# Patient Record
Sex: Male | Born: 1980
Health system: Southern US, Community
[De-identification: ages and names within clinical notes are randomized; demographics above are authoritative.]

## PROBLEM LIST (undated history)

## (undated) DIAGNOSIS — R42 Dizziness and giddiness: Secondary | ICD-10-CM

---

## 2015-06-15 ENCOUNTER — Encounter (HOSPITAL_BASED_OUTPATIENT_CLINIC_OR_DEPARTMENT_OTHER): Payer: Self-pay | Admitting: Emergency Medicine

## 2015-06-15 ENCOUNTER — Emergency Department (HOSPITAL_BASED_OUTPATIENT_CLINIC_OR_DEPARTMENT_OTHER)
Admission: EM | Admit: 2015-06-15 | Discharge: 2015-06-15 | Disposition: A | Discharge status: Home / Self Care | Payer: Self-pay | Attending: Emergency Medicine | Admitting: Emergency Medicine

## 2015-06-15 DIAGNOSIS — Z791 Long term (current) use of non-steroidal anti-inflammatories (NSAID): Secondary | ICD-10-CM | POA: Insufficient documentation

## 2015-06-15 DIAGNOSIS — L089 Local infection of the skin and subcutaneous tissue, unspecified: Secondary | ICD-10-CM | POA: Insufficient documentation

## 2015-06-15 DIAGNOSIS — F17210 Nicotine dependence, cigarettes, uncomplicated: Secondary | ICD-10-CM | POA: Insufficient documentation

## 2015-06-15 MED ORDER — SULFAMETHOXAZOLE-TRIMETHOPRIM 800-160 MG PO TABS: 1.0000 | ORAL_TABLET | Freq: Two times a day (BID) | ORAL | Status: AC

## 2015-06-15 MED FILL — SULFAMETHOXAZOLE-TMP DS TAB: 800-160 | 7 days supply | Qty: 14 | Fill #0

## 2015-06-15 NOTE — Discharge Instructions (Signed)
Please use warm compresses, monitor for signs of infection. Please return immediately if any new or worsening signs or symptoms present. Please use antibiotics as directed

## 2015-06-15 NOTE — ED Provider Notes (Signed)
CSN: [id number]     Arrival date & time 06/15/15  0907 History   First MD Initiated Contact with Patient 06/15/15 0920     Chief Complaint  Patient presents with  . Facial Swelling     (Consider location/radiation/quality/duration/timing/severity/associated sxs/prior Treatment) HPI   35 year old male presents today with reports of facial swelling. Patient reports that 4 days ago he had what appeared to be a pimple at the entrance of his right knee area. He reports that current size, turned white. He reports that 2 days ago he popped the pimple with noted discharge. Patient reports that yesterday he noticed swelling to the surrounding area. Patient denies any fever, chills, nausea, vomiting. Patient reports swelling to the upper lip. Patient notes a history of previous skin infections requiring drainage. No medications prior to arrival   History reviewed. No pertinent past medical history. History reviewed. No pertinent past surgical history. History reviewed. No pertinent family history. Social History  Substance Use Topics  . Smoking status: Current Every Day Smoker -- 2.00 packs/day    Types: Cigarettes  . Smokeless tobacco: None  . Alcohol Use: No    Review of Systems  All other systems reviewed and are negative.   Allergies  Review of patient's allergies indicates no known allergies.  Home Medications   Prior to Admission medications   Medication Sig Start Date End Date Taking? Authorizing Provider  ibuprofen (ADVIL,MOTRIN) 400 MG tablet Take 400 mg by mouth every 6 (six) hours as needed.   Yes Historical Provider, MD  sulfamethoxazole-trimethoprim (BACTRIM DS,SEPTRA DS) 800-160 MG tablet Take 1 tablet by mouth 2 (two) times daily. 06/15/15 06/22/15  Eyvonne Mechanic, PA-C   There were no vitals taken for this visit.   Physical Exam  Constitutional: He is oriented to person, place, and time. He appears well-developed and well-nourished.  HENT:  Head: Normocephalic and  atraumatic.  No swelling to face  Eyes: Conjunctivae are normal. Pupils are equal, round, and reactive to light. Right eye exhibits no discharge. Left eye exhibits no discharge. No scleral icterus.  Neck: Normal range of motion. No JVD present. No tracheal deviation present.  Pulmonary/Chest: Effort normal. No stridor.  Neurological: He is alert and oriented to person, place, and time. Coordination normal.  Skin:  There is small area of tenderness and swelling inside the right naris. No signs of surrounding swelling, edema, warmth or tenderness to palpation. No appreciable abscess on ultrasound  Psychiatric: He has a normal mood and affect. His behavior is normal. Judgment and thought content normal.  Nursing note and vitals reviewed.   ED Course  Procedures (including critical care time)  EMERGENCY DEPARTMENT US SOFT TISSUE INTERPRETATION "Study: Limited Ultrasound of the noted body part in comments below"  INDICATIONS: Soft tissue infection Multiple views of the body part are obtained with a multi-frequency linear probe  PERFORMED BY:  Myself  IMAGES ARCHIVED?: Yes  SIDE:Right   BODY PART:Other soft tisse (comment in note)  FINDINGS: No abcess noted  LIMITATIONS:  Body Habitus  INTERPRETATION:  No abcess noted  COMMENT:  No appreciable abscess noted, minor soft tissue swelling     Labs Review Labs Reviewed - No data to display  Imaging Review No results found. I have personally reviewed and evaluated these images and lab results as part of my medical decision-making.   EKG Interpretation None      MDM   Final diagnoses:  Skin infection    Labs:  Imaging:  Consults:  Therapeutics:  Discharge Meds: Bactrim  Assessment/Plan: 35 year old male presents today with an infection. Patient had what appears to be a pimple that he popped and now has surrounding infection. This appears very localized, very minimal, with no appreciable abscess on ultrasound.  Due to the location, the fact that the patient likely push the infection deeper, and history skin infections patient was started on antibiotics. He is instructed to use warm compresses, return in 2 days for reevaluation if symptoms do not improve, return immediately if they worsen. Patient is afebrile, nontoxic in no acute distress with no signs of surrounding infection. Patient discharged home. He verbalized understanding and agreement today's plan had no further questions or concerns at the time of discharge         Eyvonne MechanicJeffrey Jihad Brownlow, PA-C 06/15/15 1707  Laurence Spatesachel Morgan Little, MD 06/17/15 [phone]

## 2015-06-15 NOTE — ED Notes (Signed)
Pt directed to pharmacy to pick up prescriptions-  

## 2015-06-15 NOTE — ED Notes (Signed)
Pt reports noticing a pimple on right inner nostril on Friday, attempted to "pop" area on Friday, Saturday am noticed right nare swelling, now with right nare edema extending into right side of nose and face, denies difficulty swallowing or breathing

## 2015-11-04 ENCOUNTER — Emergency Department (HOSPITAL_BASED_OUTPATIENT_CLINIC_OR_DEPARTMENT_OTHER)
Admission: EM | Admit: 2015-11-04 | Discharge: 2015-11-04 | Disposition: A | Discharge status: Home / Self Care | Payer: No Typology Code available for payment source | Attending: Emergency Medicine | Admitting: Emergency Medicine

## 2015-11-04 ENCOUNTER — Encounter (HOSPITAL_BASED_OUTPATIENT_CLINIC_OR_DEPARTMENT_OTHER): Payer: Self-pay | Admitting: *Deleted

## 2015-11-04 DIAGNOSIS — M545 Low back pain: Secondary | ICD-10-CM | POA: Insufficient documentation

## 2015-11-04 DIAGNOSIS — F17210 Nicotine dependence, cigarettes, uncomplicated: Secondary | ICD-10-CM | POA: Diagnosis not present

## 2015-11-04 DIAGNOSIS — Y92410 Unspecified street and highway as the place of occurrence of the external cause: Secondary | ICD-10-CM | POA: Diagnosis not present

## 2015-11-04 DIAGNOSIS — M549 Dorsalgia, unspecified: Secondary | ICD-10-CM

## 2015-11-04 DIAGNOSIS — Y999 Unspecified external cause status: Secondary | ICD-10-CM | POA: Insufficient documentation

## 2015-11-04 DIAGNOSIS — M542 Cervicalgia: Secondary | ICD-10-CM | POA: Diagnosis present

## 2015-11-04 DIAGNOSIS — Y9389 Activity, other specified: Secondary | ICD-10-CM | POA: Insufficient documentation

## 2015-11-04 MED ORDER — METHOCARBAMOL 500 MG PO TABS: 500.0000 mg | ORAL_TABLET | Freq: Two times a day (BID) | ORAL | 0 refills | Status: DC

## 2015-11-04 MED ORDER — IBUPROFEN 600 MG PO TABS: 600.0000 mg | ORAL_TABLET | Freq: Four times a day (QID) | ORAL | 0 refills | Status: DC | PRN
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
## 2016-08-08 ENCOUNTER — Emergency Department (HOSPITAL_BASED_OUTPATIENT_CLINIC_OR_DEPARTMENT_OTHER)
Admission: EM | Admit: 2016-08-08 | Discharge: 2016-08-08 | Disposition: A | Discharge status: Home / Self Care | Payer: Commercial Managed Care - PPO | Attending: Emergency Medicine | Admitting: Emergency Medicine

## 2016-08-08 ENCOUNTER — Encounter (HOSPITAL_BASED_OUTPATIENT_CLINIC_OR_DEPARTMENT_OTHER): Payer: Self-pay | Admitting: *Deleted

## 2016-08-08 DIAGNOSIS — F17210 Nicotine dependence, cigarettes, uncomplicated: Secondary | ICD-10-CM | POA: Insufficient documentation

## 2016-08-08 DIAGNOSIS — R51 Headache: Secondary | ICD-10-CM | POA: Diagnosis present

## 2016-08-08 DIAGNOSIS — G44219 Episodic tension-type headache, not intractable: Secondary | ICD-10-CM | POA: Diagnosis not present

## 2016-08-08 HISTORY — DX: Dizziness and giddiness: R42

## 2016-08-08 LAB — CBG MONITORING, ED: GLUCOSE-CAPILLARY: 105 mg/dL — AB (ref 65–99)

## 2016-08-08 MED ORDER — KETOROLAC TROMETHAMINE 60 MG/2ML IM SOLN
60.0000 mg | Freq: Once | INTRAMUSCULAR | Status: AC
  Administered 2016-08-08: 60 mg via INTRAMUSCULAR
  Filled 2016-08-08: qty 2

## 2016-08-08 MED ORDER — MECLIZINE HCL 25 MG PO TABS: 25.0000 mg | ORAL_TABLET | Freq: Three times a day (TID) | ORAL | 0 refills | Status: DC | PRN

## 2016-08-08 NOTE — ED Triage Notes (Signed)
Pt c/o h/a, dizziness and blurred vision , on and off x 3 days

## 2016-08-08 NOTE — ED Provider Notes (Signed)
[facility]   CSN: [id number] Arrival date & time: 08/08/16  1542  By signing my name below, I, Henry Andrews, attest that this documentation has been prepared under the direction and in the presence of Rolan Bucco, MD. Electronically Signed: Cynda Andrews, Scribe. 08/08/16. 4:38 PM.  History   Chief Complaint Chief Complaint  Patient presents with  . Headache   HPI Comments: Henry Andrews is a 36 y.o. male with a history of vertigo, who presents to the Emergency Department complaining of gradual-onset, intermittent bifrontal headache that began three days ago. Patient states he initially thought he had a cold, but his headache has persisted. Patient reports a dull, aching headache at present. Patient denies any recent falls or head injuries. Patient reports similar headaches in the past when he was diagnosed with vertigo. Patient states he has had one episode of vertigo, but has not had any vertigo symptoms since.  Patient reports taking pseudofed with no relief. Nothing improves or worsens his headache. No past medical history of diabetes, stroke, or cancer. Patient denies any current dizziness, visual changes, numbness, speech deficits, neck pain, back pain, fever, chills, or LE edema. No recent head trauma.  The history is provided by the patient. No language interpreter was used.    Past Medical History:  Diagnosis Date  . Vertigo     There are no active problems to display for this patient.   History reviewed. No pertinent surgical history.     Home Medications    Prior to Admission medications   Medication Sig Start Date End Date Taking? Authorizing Provider  ibuprofen (ADVIL,MOTRIN) 600 MG tablet Take 1 tablet (600 mg total) by mouth every 6 (six) hours as needed. 11/04/15   Santiago Glad, PA-C  meclizine (ANTIVERT) 25 MG tablet Take 1 tablet (25 mg total) by mouth 3 (three) times daily as needed for dizziness. 08/08/16   Rolan Bucco, MD      Family History No family history on file.  Social History Social History  Substance Use Topics  . Smoking status: Current Every Day Smoker    Packs/day: 2.00    Types: Cigarettes  . Smokeless tobacco: Not on file  . Alcohol use No     Allergies   Patient has no known allergies.   Review of Systems Review of Systems  Constitutional: Negative for chills, diaphoresis, fatigue and fever.  HENT: Negative for congestion, rhinorrhea and sneezing.   Eyes: Negative.  Negative for visual disturbance.  Respiratory: Negative for cough, chest tightness and shortness of breath.   Cardiovascular: Negative for chest pain and leg swelling.  Gastrointestinal: Negative for abdominal pain, blood in stool, diarrhea, nausea and vomiting.  Genitourinary: Negative for difficulty urinating, flank pain, frequency and hematuria.  Musculoskeletal: Negative for arthralgias, back pain, gait problem and neck pain.  Skin: Negative for rash.  Neurological: Positive for dizziness, light-headedness and headaches. Negative for speech difficulty, weakness and numbness.  All other systems reviewed and are negative.    Physical Exam Updated Vital Signs BP 119/81 (BP Location: Right Arm)   Pulse (!) 56   Temp 97.7 F (36.5 C)   Resp 16   Ht 5\' 10"  (1.778 m)   Wt 111.1 kg (245 lb)   SpO2 100%   BMI 35.15 kg/m   Physical Exam  Constitutional: He is oriented to person, place, and time. He appears well-developed and well-nourished.  HENT:  Head: Normocephalic and atraumatic.  Mouth/Throat: Oropharynx is clear and moist.  No  TTP over sinuses/temporal arteries  Eyes: Conjunctivae and EOM are normal. Pupils are equal, round, and reactive to light.  No nystagmus  Neck: Normal range of motion. Neck supple.  Cardiovascular: Normal rate, regular rhythm and normal heart sounds.   Pulmonary/Chest: Effort normal and breath sounds normal. No respiratory distress. He has no wheezes. He has no rales. He  exhibits no tenderness.  Abdominal: Soft. Bowel sounds are normal. There is no tenderness. There is no rebound and no guarding.  Musculoskeletal: Normal range of motion. He exhibits no edema.  Lymphadenopathy:    He has no cervical adenopathy.  Neurological: He is alert and oriented to person, place, and time. He displays normal reflexes. No cranial nerve deficit or sensory deficit. He exhibits normal muscle tone. Coordination normal.  Motor 5/5 all extremities Sensation grossly intact to LT all extremities Finger to Nose intact, no pronator drift CN II-XII grossly intact Gait normal   Skin: Skin is warm and dry. No rash noted.  Psychiatric: He has a normal mood and affect.  Nursing note and vitals reviewed.    ED Treatments / Results  DIAGNOSTIC STUDIES: Oxygen Saturation is 100% on RA, normal by my interpretation.    COORDINATION OF CARE: 4:37 PM Discussed treatment plan with pt at bedside and pt agreed to plan.    Labs (all labs ordered are listed, but only abnormal results are displayed) Labs Reviewed  CBG MONITORING, ED - Abnormal; Notable for the following:       Result Value   Glucose-Capillary 105 (*)    All other components within normal limits    EKG  EKG Interpretation None       Radiology No results found.  Procedures Procedures (including critical care time)  Medications Ordered in ED Medications  ketorolac (TORADOL) injection 60 mg (60 mg Intramuscular Given 08/08/16 1645)     Initial Impression / Assessment and Plan / ED Course  I have reviewed the triage vital signs and the nursing notes.  Pertinent labs & imaging results that were available during my care of the patient were reviewed by me and considered in my medical decision making (see chart for details).     Patient presents with a bifrontal-type headache. There is no symptoms that would be more suggestive of subarachnoid hemorrhage or meningitis. He has no neurologic deficits. He is able  to ambulate without ataxia. He was given dose of Toradol in the ED and feels it's better after that. He states his headache is almost gone. I don't feel this point that he needs imaging studies. He was discharged home in good condition. He currently denies any vertiginous type symptoms but he states he does get it intermittently and is requesting some meclizine to help treat the vertigo when he does get the symptoms. He was encouraged to establish care with a primary care provider. He was given a list of low-cost health clinics. He was given return precautions. His repeat blood pressure was 124/83.  Final Clinical Impressions(s) / ED Diagnoses   Final diagnoses:  Episodic tension-type headache, not intractable    New Prescriptions New Prescriptions   MECLIZINE (ANTIVERT) 25 MG TABLET    Take 1 tablet (25 mg total) by mouth 3 (three) times daily as needed for dizziness.   I personally performed the services described in this documentation, which was scribed in my presence.  The recorded information has been reviewed and considered.     Rolan BuccoBelfi, Elion Hocker, MD 08/08/16 [phone]

## 2016-09-26 ENCOUNTER — Emergency Department (HOSPITAL_BASED_OUTPATIENT_CLINIC_OR_DEPARTMENT_OTHER)
Admission: EM | Admit: 2016-09-26 | Discharge: 2016-09-27 | Disposition: A | Discharge status: Home / Self Care | Payer: Commercial Managed Care - PPO | Attending: Emergency Medicine | Admitting: Emergency Medicine

## 2016-09-26 ENCOUNTER — Encounter (HOSPITAL_BASED_OUTPATIENT_CLINIC_OR_DEPARTMENT_OTHER): Payer: Self-pay | Admitting: *Deleted

## 2016-09-26 DIAGNOSIS — F17210 Nicotine dependence, cigarettes, uncomplicated: Secondary | ICD-10-CM | POA: Insufficient documentation

## 2016-09-26 DIAGNOSIS — Z79899 Other long term (current) drug therapy: Secondary | ICD-10-CM | POA: Diagnosis not present

## 2016-09-26 DIAGNOSIS — R51 Headache: Secondary | ICD-10-CM | POA: Diagnosis present

## 2016-09-26 DIAGNOSIS — R519 Headache, unspecified: Secondary | ICD-10-CM

## 2016-09-26 NOTE — ED Provider Notes (Signed)
[facility] Provider Note: Lowella Dell, MD, FACEP  CSN: [id number] MRN: [id number] ARRIVAL: 09/26/16 at 2034 ROOM: [facility]08/[facility]08   CHIEF COMPLAINT  Headache   HISTORY OF PRESENT ILLNESS  09/26/16 11:59 PM Henry Andrews is a 36 y.o. male who awoke with a headache this morning. The headache is located bifrontally. He rates it as a 6 or 7 out of 10 and dull. He has had several episodes of vertigo throughout the day. These episodes last about 3 minutes. They're worse with movement of the head, particularly bending over. They're not sustained. He took ibuprofen and meclizine about 6 PM without relief. He denies visual changes, blurred vision, nausea, vomiting, hearing changes or focal neurologic changes. He had similar symptoms in July and was diagnosed with episodic tension-type headache.   Past Medical History:  Diagnosis Date  . Vertigo     History reviewed. No pertinent surgical history.  No family history on file.  Social History  Substance Use Topics  . Smoking status: Current Every Day Smoker    Packs/day: 2.00    Types: Cigarettes  . Smokeless tobacco: Never Used  . Alcohol use No    Prior to Admission medications   Medication Sig Start Date End Date Taking? Authorizing Provider  ibuprofen (ADVIL,MOTRIN) 600 MG tablet Take 1 tablet (600 mg total) by mouth every 6 (six) hours as needed. 11/04/15   Santiago Glad, PA-C  meclizine (ANTIVERT) 25 MG tablet Take 1 tablet (25 mg total) by mouth 3 (three) times daily as needed for dizziness. 08/08/16   Rolan Bucco, MD    Allergies Patient has no known allergies.   REVIEW OF SYSTEMS  Negative except as noted here or in the History of Present Illness.   PHYSICAL EXAMINATION  Initial Vital Signs Blood pressure (!) 142/95, pulse 92, temperature 98.5 F (36.9 C), temperature source Oral, resp. rate 18, height 5\' 10"  (1.778 m), weight 113.4 kg (250 lb), SpO2 99 %.  Examination General: Well-developed,  well-nourished male in no acute distress; appearance consistent with age of record HENT: normocephalic; atraumatic Eyes: pupils equal, round and reactive to light; extraocular muscles intact; no nystagmus Neck: supple Heart: regular rate and rhythm Lungs: clear to auscultation bilaterally Abdomen: soft; nondistended; nontender; bowel sounds present Extremities: No deformity; full range of motion; pulses normal Neurologic: Awake, alert and oriented; motor function intact in all extremities and symmetric; no facial droop Skin: Warm and dry Psychiatric: Normal mood and affect   RESULTS  Summary of this visit's results, reviewed by myself:   EKG Interpretation  Date/Time:    Ventricular Rate:    PR Interval:    QRS Duration:   QT Interval:    QTC Calculation:   R Axis:     Text Interpretation:        Laboratory Studies: No results found for this or any previous visit (from the past 24 hour(s)). Imaging Studies: No results found.  ED COURSE  Nursing notes and initial vitals signs, including pulse oximetry, reviewed.  Vitals:   09/26/16 2049 09/26/16 2051 09/26/16 2300 09/27/16 0000  BP:  (!) 141/98 (!) 142/95 117/71  Pulse:  77 92 61  Resp:  18 18 20   Temp:  98.5 F (36.9 C)    TempSrc:  Oral    SpO2:  99% 99% 100%  Weight: 113.4 kg (250 lb)     Height: 5\' 10"  (1.778 m)      12:48 AM Headache improved after IV fluids and medications. Patient states  she is ready to go home.  PROCEDURES    ED DIAGNOSES     ICD-10-CM   1. Bad headache R51        Adams Hinch, MD 09/27/16 [phone]

## 2016-09-26 NOTE — ED Triage Notes (Signed)
Headache and dizziness. He was seen for same last month and told to take Ibuprofen and Meclizine. He has take the medications today with no relief.

## 2016-09-27 MED ORDER — DIPHENHYDRAMINE HCL 50 MG/ML IJ SOLN
25.0000 mg | Freq: Once | INTRAMUSCULAR | Status: AC
  Administered 2016-09-27: 25 mg via INTRAVENOUS
  Filled 2016-09-27: qty 1

## 2016-09-27 MED ORDER — SODIUM CHLORIDE 0.9 % IV BOLUS (SEPSIS)
1000.0000 mL | Freq: Once | INTRAVENOUS | Status: AC
  Administered 2016-09-27: 1000 mL via INTRAVENOUS

## 2016-09-27 MED ORDER — METOCLOPRAMIDE HCL 5 MG/ML IJ SOLN
10.0000 mg | Freq: Once | INTRAMUSCULAR | Status: AC
  Administered 2016-09-27: 10 mg via INTRAVENOUS
  Filled 2016-09-27: qty 2

## 2016-09-27 MED ORDER — KETOROLAC TROMETHAMINE 15 MG/ML IJ SOLN
15.0000 mg | Freq: Once | INTRAMUSCULAR | Status: AC
  Administered 2016-09-27: 15 mg via INTRAVENOUS
  Filled 2016-09-27: qty 1

## 2017-03-14 ENCOUNTER — Encounter (HOSPITAL_BASED_OUTPATIENT_CLINIC_OR_DEPARTMENT_OTHER): Payer: Self-pay

## 2017-03-14 ENCOUNTER — Emergency Department (HOSPITAL_BASED_OUTPATIENT_CLINIC_OR_DEPARTMENT_OTHER): Payer: Commercial Managed Care - PPO

## 2017-03-14 ENCOUNTER — Emergency Department (HOSPITAL_BASED_OUTPATIENT_CLINIC_OR_DEPARTMENT_OTHER)
Admission: EM | Admit: 2017-03-14 | Discharge: 2017-03-14 | Disposition: A | Discharge status: Home / Self Care | Payer: Commercial Managed Care - PPO | Attending: Emergency Medicine | Admitting: Emergency Medicine

## 2017-03-14 DIAGNOSIS — F17210 Nicotine dependence, cigarettes, uncomplicated: Secondary | ICD-10-CM | POA: Diagnosis not present

## 2017-03-14 DIAGNOSIS — R1110 Vomiting, unspecified: Secondary | ICD-10-CM | POA: Diagnosis present

## 2017-03-14 DIAGNOSIS — J189 Pneumonia, unspecified organism: Secondary | ICD-10-CM

## 2017-03-14 DIAGNOSIS — R69 Illness, unspecified: Secondary | ICD-10-CM

## 2017-03-14 DIAGNOSIS — J111 Influenza due to unidentified influenza virus with other respiratory manifestations: Secondary | ICD-10-CM

## 2017-03-14 LAB — CBC WITH DIFFERENTIAL/PLATELET
Basophils Absolute: 0 10*3/uL (ref 0.0–0.1)
Basophils Relative: 0 %
EOS ABS: 0.1 10*3/uL (ref 0.0–0.7)
Eosinophils Relative: 2 %
HCT: 42.9 % (ref 39.0–52.0)
HEMOGLOBIN: 14.3 g/dL (ref 13.0–17.0)
LYMPHS ABS: 2.7 10*3/uL (ref 0.7–4.0)
LYMPHS PCT: 38 %
MCH: 28.3 pg (ref 26.0–34.0)
MCHC: 33.3 g/dL (ref 30.0–36.0)
MCV: 85 fL (ref 78.0–100.0)
Monocytes Absolute: 0.6 10*3/uL (ref 0.1–1.0)
Monocytes Relative: 9 %
NEUTROS ABS: 3.6 10*3/uL (ref 1.7–7.7)
Neutrophils Relative %: 51 %
Platelets: 244 10*3/uL (ref 150–400)
RBC: 5.05 MIL/uL (ref 4.22–5.81)
RDW: 13.9 % (ref 11.5–15.5)
WBC: 7 10*3/uL (ref 4.0–10.5)

## 2017-03-14 LAB — COMPREHENSIVE METABOLIC PANEL
ALK PHOS: 63 U/L (ref 38–126)
ALT: 39 U/L (ref 17–63)
ANION GAP: 9 (ref 5–15)
AST: 23 U/L (ref 15–41)
Albumin: 4 g/dL (ref 3.5–5.0)
BILIRUBIN TOTAL: 0.6 mg/dL (ref 0.3–1.2)
BUN: 13 mg/dL (ref 6–20)
CALCIUM: 8.9 mg/dL (ref 8.9–10.3)
CO2: 23 mmol/L (ref 22–32)
Chloride: 106 mmol/L (ref 101–111)
Creatinine, Ser: 0.91 mg/dL (ref 0.61–1.24)
GFR calc Af Amer: >60 mL/min (ref >60)
GFR calc non Af Amer: >60 mL/min (ref >60)
GLUCOSE: 114 mg/dL — AB (ref 65–99)
Potassium: 3.4 mmol/L — ABNORMAL LOW (ref 3.5–5.1)
SODIUM: 138 mmol/L (ref 135–145)
Total Protein: 7.8 g/dL (ref 6.5–8.1)

## 2017-03-14 MED ORDER — SODIUM CHLORIDE 0.9 % IV BOLUS (SEPSIS)
1000.0000 mL | Freq: Once | INTRAVENOUS | Status: AC
Start: 2017-03-14 — End: 2017-03-14
  Administered 2017-03-14: 1000 mL via INTRAVENOUS

## 2017-03-14 MED ORDER — POTASSIUM CHLORIDE CRYS ER 20 MEQ PO TBCR
40.0000 meq | EXTENDED_RELEASE_TABLET | Freq: Once | ORAL | Status: AC
  Administered 2017-03-14: 40 meq via ORAL
  Filled 2017-03-14: qty 2

## 2017-03-14 MED ORDER — AZITHROMYCIN 250 MG PO TABS
250.0000 mg | ORAL_TABLET | Freq: Every day | ORAL | 0 refills | Status: DC
Start: 2017-03-14 — End: 2017-04-17

## 2017-03-14 MED FILL — AZITHROMYCIN 250 MG TABLET: 250 | 5 days supply | Qty: 6 | Fill #0

## 2017-03-14 NOTE — ED Triage Notes (Signed)
Pt reports N/V/D, cough, and fatigue x 2 days.

## 2017-03-14 NOTE — ED Notes (Signed)
Patient given ginger ale for fluid challenge. 

## 2017-03-14 NOTE — ED Provider Notes (Signed)
[facility] EMERGENCY DEPARTMENT Provider Note   CSN: [id number] Arrival date & time: 03/14/17  4098     History   Chief Complaint Chief Complaint  Patient presents with  . Emesis    HPI Henry Andrews is a 37 y.o. male.  HPI  37 year old male presents with flulike symptoms and concern for the flu.  He states that starting 2 days ago when he first woke up he developed cough with occasional yellow sputum, sore throat, body aches, and yesterday had vomiting and diarrhea.  He is not sure how many times he had the vomiting and diarrhea but has not had any this morning.  Some lightheadedness.  He has not had anything to eat or drink this morning.  He states he does not feel short of breath but has had some chest pain when coughing.  Past Medical History:  Diagnosis Date  . Vertigo     There are no active problems to display for this patient.   History reviewed. No pertinent surgical history.     Home Medications    Prior to Admission medications   Medication Sig Start Date End Date Taking? Authorizing Provider  azithromycin (ZITHROMAX) 250 MG tablet Take 1 tablet (250 mg total) by mouth daily. Take first 2 tablets together, then 1 every day until finished. 03/14/17   Pricilla Loveless, MD  ibuprofen (ADVIL,MOTRIN) 600 MG tablet Take 1 tablet (600 mg total) by mouth every 6 (six) hours as needed. 11/04/15   Santiago Glad, PA-C  meclizine (ANTIVERT) 25 MG tablet Take 1 tablet (25 mg total) by mouth 3 (three) times daily as needed for dizziness. 08/08/16   Rolan Bucco, MD    Family History No family history on file.  Social History Social History   Tobacco Use  . Smoking status: Current Every Day Smoker    Packs/day: 2.00    Types: Cigarettes  . Smokeless tobacco: Never Used  Substance Use Topics  . Alcohol use: No  . Drug use: No     Allergies   Patient has no known allergies.   Review of Systems Review of Systems  Constitutional: Negative for  fever.  HENT: Positive for sore throat.   Respiratory: Positive for cough. Negative for shortness of breath.   Cardiovascular: Positive for chest pain.  Gastrointestinal: Positive for diarrhea and vomiting. Negative for abdominal pain.  Musculoskeletal: Positive for myalgias.  All other systems reviewed and are negative.    Physical Exam Updated Vital Signs BP 117/79 (BP Location: Right Arm)   Pulse 66   Temp 98.8 F (37.1 C) (Oral)   Resp (!) 22   Ht 5\' 10"  (1.778 m)   Wt 117.9 kg (260 lb)   SpO2 98%   BMI 37.31 kg/m   Physical Exam  Constitutional: He is oriented to person, place, and time. He appears well-developed and well-nourished. No distress.  HENT:  Head: Normocephalic and atraumatic.  Right Ear: External ear normal.  Left Ear: External ear normal.  Nose: Nose normal.  Mouth/Throat: Oropharynx is clear and moist. No oropharyngeal exudate.  Eyes: Right eye exhibits no discharge. Left eye exhibits no discharge.  Neck: Neck supple.  Cardiovascular: Normal rate, regular rhythm and normal heart sounds.  Pulmonary/Chest: Effort normal and breath sounds normal. He has no wheezes. He has no rales.  Abdominal: Soft. He exhibits no distension. There is no tenderness.  Musculoskeletal: He exhibits no edema.  Neurological: He is alert and oriented to person, place, and time.  Skin: Skin  is warm and dry. He is not diaphoretic.  Nursing note and vitals reviewed.    ED Treatments / Results  Labs (all labs ordered are listed, but only abnormal results are displayed) Labs Reviewed  COMPREHENSIVE METABOLIC PANEL - Abnormal; Notable for the following components:      Result Value   Potassium 3.4 (*)    Glucose, Bld 114 (*)    All other components within normal limits  CBC WITH DIFFERENTIAL/PLATELET    EKG  EKG Interpretation None       Radiology Dg Chest 2 View  Result Date: 03/14/2017 CLINICAL DATA:  Cough and fatigue.  Nausea, vomiting, diarrhea. EXAM: CHEST   2 VIEW COMPARISON:  No recent. FINDINGS: Patient is rotated to the right. Given rotation mediastinal hilar structures are normal. Heart size normal. Low lung volumes. Mild atelectasis left lung base. Elevation left hemidiaphragm. Mild left base infiltrate cannot be excluded. No pleural effusion or pneumothorax. No acute bony abnormality. IMPRESSION: Low lung volumes with mild atelectasis left lung base. Mild left base infiltrate cannot be excluded. Elevation left hemidiaphragm. Electronically Signed   By: Maisie Fushomas  Register   On: 03/14/2017 10:11    Procedures Procedures (including critical care time)  Medications Ordered in ED Medications  sodium chloride 0.9 % bolus 1,000 mL (0 mLs Intravenous Stopped 03/14/17 1112)  potassium chloride SA (K-DUR,KLOR-CON) CR tablet 40 mEq (40 mEq Oral Given 03/14/17 1111)     Initial Impression / Assessment and Plan / ED Course  I have reviewed the triage vital signs and the nursing notes.  Pertinent labs & imaging results that were available during my care of the patient were reviewed by me and considered in my medical decision making (see chart for details).     Patient overall appears well.  Given his symptoms of lightheadedness, he was given a bolus of fluids and labs were evaluated.  Mild hypokalemia but otherwise unremarkable.  This was repleted in the ED.  Given his productive cough and chest pain, chest x-ray obtained and shows possible infiltrate.  He will thus be treated with antibiotics but he otherwise appears stable for discharge home.  Discussed return precautions.  Final Clinical Impressions(s) / ED Diagnoses   Final diagnoses:  Atypical pneumonia  Influenza-like illness    ED Discharge Orders        Ordered    azithromycin (ZITHROMAX) 250 MG tablet  Daily     03/14/17 1140       Pricilla LovelessGoldston, Akul Leggette, MD 03/14/17 1150

## 2017-03-31 ENCOUNTER — Emergency Department (HOSPITAL_BASED_OUTPATIENT_CLINIC_OR_DEPARTMENT_OTHER): Payer: Commercial Managed Care - PPO

## 2017-03-31 ENCOUNTER — Encounter (HOSPITAL_BASED_OUTPATIENT_CLINIC_OR_DEPARTMENT_OTHER): Payer: Self-pay | Admitting: Emergency Medicine

## 2017-03-31 ENCOUNTER — Other Ambulatory Visit: Payer: Self-pay

## 2017-03-31 ENCOUNTER — Emergency Department (HOSPITAL_BASED_OUTPATIENT_CLINIC_OR_DEPARTMENT_OTHER)
Admission: EM | Admit: 2017-03-31 | Discharge: 2017-03-31 | Disposition: A | Discharge status: Home / Self Care | Payer: Commercial Managed Care - PPO | Attending: Emergency Medicine | Admitting: Emergency Medicine

## 2017-03-31 DIAGNOSIS — R0789 Other chest pain: Secondary | ICD-10-CM | POA: Diagnosis present

## 2017-03-31 DIAGNOSIS — F17210 Nicotine dependence, cigarettes, uncomplicated: Secondary | ICD-10-CM | POA: Diagnosis not present

## 2017-03-31 DIAGNOSIS — R61 Generalized hyperhidrosis: Secondary | ICD-10-CM | POA: Diagnosis not present

## 2017-03-31 DIAGNOSIS — Z79899 Other long term (current) drug therapy: Secondary | ICD-10-CM | POA: Insufficient documentation

## 2017-03-31 LAB — COMPREHENSIVE METABOLIC PANEL
ALK PHOS: 58 U/L (ref 38–126)
ALT: 35 U/L (ref 17–63)
AST: 29 U/L (ref 15–41)
Albumin: 4 g/dL (ref 3.5–5.0)
Anion gap: 9 (ref 5–15)
BUN: 11 mg/dL (ref 6–20)
CALCIUM: 9 mg/dL (ref 8.9–10.3)
CHLORIDE: 106 mmol/L (ref 101–111)
CO2: 24 mmol/L (ref 22–32)
CREATININE: 0.82 mg/dL (ref 0.61–1.24)
Glucose, Bld: 95 mg/dL (ref 65–99)
Potassium: 3.4 mmol/L — ABNORMAL LOW (ref 3.5–5.1)
SODIUM: 139 mmol/L (ref 135–145)
Total Bilirubin: 0.7 mg/dL (ref 0.3–1.2)
Total Protein: 7.4 g/dL (ref 6.5–8.1)

## 2017-03-31 LAB — CBC
HEMATOCRIT: 40.2 % (ref 39.0–52.0)
Hemoglobin: 13.4 g/dL (ref 13.0–17.0)
MCH: 28.5 pg (ref 26.0–34.0)
MCHC: 33.3 g/dL (ref 30.0–36.0)
MCV: 85.4 fL (ref 78.0–100.0)
Platelets: 270 10*3/uL (ref 150–400)
RBC: 4.71 MIL/uL (ref 4.22–5.81)
RDW: 14.1 % (ref 11.5–15.5)
WBC: 9.8 10*3/uL (ref 4.0–10.5)

## 2017-03-31 LAB — TROPONIN I

## 2017-03-31 MED ORDER — IBUPROFEN 800 MG PO TABS: 800.0000 mg | ORAL_TABLET | Freq: Three times a day (TID) | ORAL | 0 refills | Status: DC

## 2017-03-31 MED ORDER — OMEPRAZOLE 20 MG PO CPDR: 20.0000 mg | DELAYED_RELEASE_CAPSULE | Freq: Every day | ORAL | 0 refills | Status: DC

## 2017-03-31 MED ORDER — IBUPROFEN 800 MG PO TABS
800.0000 mg | ORAL_TABLET | Freq: Once | ORAL | Status: AC
  Administered 2017-03-31: 800 mg via ORAL
  Filled 2017-03-31: qty 1

## 2017-03-31 MED ORDER — GI COCKTAIL ~~LOC~~
30.0000 mL | Freq: Once | ORAL | Status: AC
Start: 2017-03-31 — End: 2017-03-31
  Administered 2017-03-31: 30 mL via ORAL
  Filled 2017-03-31: qty 30

## 2017-03-31 MED FILL — OMEPRAZOLE 20 MG CAP: 20 | 14 days supply | Qty: 14 | Fill #0

## 2017-03-31 MED FILL — IBUPROFEN 800 MG TAB: 800 | 7 days supply | Qty: 21 | Fill #0

## 2017-03-31 NOTE — ED Provider Notes (Signed)
[facility] EMERGENCY DEPARTMENT Provider Note   CSN: [id number] Arrival date & time: 03/31/17  1314     History   Chief Complaint Chief Complaint  Patient presents with  . Chest Pain    HPI Henry Andrews is a 37 y.o. male.  The history is provided by the patient. No language interpreter was used.  Chest Pain     Henry Andrews is a 37 y.o. male who presents to the Emergency Department complaining of chest pain.  This morning he developed central, substernal chest pain.  Pain is waxing and waning in nature.  It is worse with rotation and movement of his chest.  No change with exertion, breathing.  He does have occasional diaphoresis.  Diaphoresis is not clearly related to the pain.  This morning he took an aspirin and when he arrived to work he thought he had some reflux so he took a Prilosec and Tums with no improvement in his symptoms.  No prior similar symptoms.  He denies any cough, shortness of breath, nausea, vomiting, abdominal pain, leg swelling or pain.  He states when he drinks water it feels a little different like something might be caught.  He has able to swallow and keep down liquids.  He has no medical problems.  He does have a family history of cardiac disease in his father.  He does smoke tobacco and uses occasional alcohol.  No history of DVT or recent surgery. Past Medical History:  Diagnosis Date  . Vertigo     There are no active problems to display for this patient.   History reviewed. No pertinent surgical history.     Home Medications    Prior to Admission medications   Medication Sig Start Date End Date Taking? Authorizing Provider  azithromycin (ZITHROMAX) 250 MG tablet Take 1 tablet (250 mg total) by mouth daily. Take first 2 tablets together, then 1 every day until finished. 03/14/17   Pricilla Loveless, MD  ibuprofen (ADVIL,MOTRIN) 800 MG tablet Take 1 tablet (800 mg total) by mouth 3 (three) times daily. 03/31/17   Tilden Fossa, MD    meclizine (ANTIVERT) 25 MG tablet Take 1 tablet (25 mg total) by mouth 3 (three) times daily as needed for dizziness. 08/08/16   Rolan Bucco, MD  omeprazole (PRILOSEC) 20 MG capsule Take 1 capsule (20 mg total) by mouth daily. 03/31/17   Tilden Fossa, MD    Family History History reviewed. No pertinent family history.  Social History Social History   Tobacco Use  . Smoking status: Current Every Day Smoker    Packs/day: 2.00    Types: Cigarettes  . Smokeless tobacco: Never Used  Substance Use Topics  . Alcohol use: No  . Drug use: No     Allergies   Patient has no known allergies.   Review of Systems Review of Systems  Cardiovascular: Positive for chest pain.  All other systems reviewed and are negative.    Physical Exam Updated Vital Signs BP (!) 139/92 (BP Location: Left Arm)   Pulse 76   Temp 98.1 F (36.7 C) (Oral)   Resp 20   Ht 5\' 10"  (1.778 m)   Wt 117.9 kg (260 lb)   SpO2 100%   BMI 37.31 kg/m   Physical Exam  Constitutional: He is oriented to person, place, and time. He appears well-developed and well-nourished.  HENT:  Head: Normocephalic and atraumatic.  Cardiovascular: Normal rate and regular rhythm.  No murmur heard. Pulmonary/Chest: Effort normal and breath  sounds normal. No respiratory distress.  Abdominal: Soft. There is no tenderness. There is no rebound and no guarding.  Musculoskeletal: He exhibits no edema or tenderness.  Neurological: He is alert and oriented to person, place, and time.  Skin: Skin is warm and dry.  Psychiatric: He has a normal mood and affect. His behavior is normal.  Nursing note and vitals reviewed.    ED Treatments / Results  Labs (all labs ordered are listed, but only abnormal results are displayed) Labs Reviewed  COMPREHENSIVE METABOLIC PANEL - Abnormal; Notable for the following components:      Result Value   Potassium 3.4 (*)    All other components within normal limits  CBC  TROPONIN I    EKG   EKG Interpretation  Date/Time:  Friday March 31 2017 13:19:30 EST Ventricular Rate:  67 PR Interval:  154 QRS Duration: 82 QT Interval:  384 QTC Calculation: 405 R Axis:   51 Text Interpretation:  Normal sinus rhythm Normal ECG Confirmed by Tilden Fossaees, Amariah Kierstead [phone]) on 03/31/2017 3:13:32 PM       Radiology Dg Chest 2 View  Result Date: 03/31/2017 CLINICAL DATA:  Chest pain EXAM: CHEST  2 VIEW COMPARISON:  March 14, 2017 FINDINGS: Lungs are clear. The heart size and pulmonary vascularity are normal. No adenopathy. No pneumothorax. No bone lesions. IMPRESSION: No edema or consolidation. Electronically Signed   By: Bretta BangWilliam  Woodruff III M.D.   On: 03/31/2017 13:30    Procedures Procedures (including critical care time)  Medications Ordered in ED Medications  gi cocktail (Maalox,Lidocaine,Donnatal) (30 mLs Oral Given 03/31/17 1524)  ibuprofen (ADVIL,MOTRIN) tablet 800 mg (800 mg Oral Given 03/31/17 1712)     Initial Impression / Assessment and Plan / ED Course  I have reviewed the triage vital signs and the nursing notes.  Pertinent labs & imaging results that were available during my care of the patient were reviewed by me and considered in my medical decision making (see chart for details).     Patient here for evaluation of chest pain that began today.  Pain is worse with rotational movements of the body as well as swallowing liquids.  Presentation is not consistent with ACS, PE, dissection, CHF, pneumonia.  Discussed with patient home care for reflux/gastritis as well as musculoskeletal pain.  Outpatient follow-up and return precautions discussed.  Final Clinical Impressions(s) / ED Diagnoses   Final diagnoses:  Chest wall pain    ED Discharge Orders        Ordered    ibuprofen (ADVIL,MOTRIN) 800 MG tablet  3 times daily     03/31/17 1655    omeprazole (PRILOSEC) 20 MG capsule  Daily     03/31/17 1655       Tilden Fossaees, Jaquanna Ballentine, MD 03/31/17 2349

## 2017-03-31 NOTE — ED Notes (Signed)
NAD at this time. Pt is stable and going home.  

## 2017-03-31 NOTE — ED Triage Notes (Signed)
Patient reports intermittent central chest pain which began last night.  Denies nausea, vomiting, shortness of breath, dizziness.

## 2017-04-17 ENCOUNTER — Encounter (HOSPITAL_BASED_OUTPATIENT_CLINIC_OR_DEPARTMENT_OTHER): Payer: Self-pay

## 2017-04-17 ENCOUNTER — Emergency Department (HOSPITAL_BASED_OUTPATIENT_CLINIC_OR_DEPARTMENT_OTHER)
Admission: EM | Admit: 2017-04-17 | Discharge: 2017-04-18 | Disposition: A | Discharge status: Home / Self Care | Payer: Commercial Managed Care - PPO | Attending: Emergency Medicine | Admitting: Emergency Medicine

## 2017-04-17 ENCOUNTER — Other Ambulatory Visit: Payer: Self-pay

## 2017-04-17 DIAGNOSIS — L02215 Cutaneous abscess of perineum: Secondary | ICD-10-CM | POA: Diagnosis not present

## 2017-04-17 DIAGNOSIS — R600 Localized edema: Secondary | ICD-10-CM | POA: Diagnosis present

## 2017-04-17 DIAGNOSIS — F17210 Nicotine dependence, cigarettes, uncomplicated: Secondary | ICD-10-CM | POA: Insufficient documentation

## 2017-04-17 MED ORDER — LIDOCAINE HCL 2 % IJ SOLN
10.0000 mL | Freq: Once | INTRAMUSCULAR | Status: AC
  Administered 2017-04-18: 200 mg
  Filled 2017-04-17: qty 20

## 2017-04-17 NOTE — ED Provider Notes (Signed)
[facility] EMERGENCY DEPARTMENT Provider Note   CSN: [id number] Arrival date & time: 04/17/17  1951     History   Chief Complaint Chief Complaint  Patient presents with  . Abscess    HPI Henry Andrews is a 37 y.o. male.  The history is provided by the patient.  He comes in with a 3-day history of a painful lump in the perineal area which has been enlarging.  He currently rates pain a 10/10.  He had a lump there about 8 years ago which was an abscess which was drained.  He denies fever or chills.  He has not tried anything to treat this.  Past Medical History:  Diagnosis Date  . Vertigo     There are no active problems to display for this patient.   History reviewed. No pertinent surgical history.     Home Medications    Prior to Admission medications   Not on File    Family History No family history on file.  Social History Social History   Tobacco Use  . Smoking status: Current Every Day Smoker    Packs/day: 2.00    Types: Cigarettes  . Smokeless tobacco: Never Used  Substance Use Topics  . Alcohol use: No  . Drug use: No     Allergies   Patient has no known allergies.   Review of Systems Review of Systems  All other systems reviewed and are negative.    Physical Exam Updated Vital Signs BP 133/84 (BP Location: Right Arm)   Pulse 88   Temp 99.1 F (37.3 C) (Oral)   Resp 18   Ht 5\' 10"  (1.778 m)   Wt 117.9 kg (260 lb)   SpO2 99%   BMI 37.31 kg/m   Physical Exam  Nursing note and vitals reviewed.  37 year old male, resting comfortably and in no acute distress. Vital signs are normal. Oxygen saturation is 99%, which is normal. Head is normocephalic and atraumatic. PERRLA, EOMI. Oropharynx is clear. Neck is nontender and supple without adenopathy or JVD. Back is nontender and there is no CVA tenderness. Lungs are clear without rales, wheezes, or rhonchi. Chest is nontender. Heart has regular rate and rhythm without  murmur. Abdomen is soft, flat, nontender without masses or hepatosplenomegaly and peristalsis is normoactive. Genitalia: Abscess in the perineal area.  No overlying erythema.  No extension to the perirectal area and no involvement of the scrotum. Extremities have no cyanosis or edema, full range of motion is present. Skin is warm and dry without rash. Neurologic: Mental status is normal, cranial nerves are intact, there are no motor or sensory deficits.  ED Treatments / Results  Labs (all labs ordered are listed, but only abnormal results are displayed) Labs Reviewed  CBG MONITORING, ED - Abnormal; Notable for the following components:      Result Value   Glucose-Capillary 100 (*)    All other components within normal limits    Procedures .Marland KitchenIncision and Drainage Date/Time: 04/18/2017 12:30 AM Performed by: Dione Booze, MD Authorized by: Dione Booze, MD   Consent:    Consent obtained:  Verbal   Consent given by:  Patient   Risks discussed:  Bleeding, incomplete drainage and pain   Alternatives discussed:  No treatment Location:    Type:  Abscess   Size:  4 cmm   Location:  Anogenital   Anogenital location:  Perineum Pre-procedure details:    Skin preparation:  Chloraprep Anesthesia (see MAR for exact dosages):  Anesthesia method:  None Procedure type:    Complexity:  Complex Procedure details:    Needle aspiration: no     Incision types:  Cruciate   Incision depth:  Subcutaneous   Scalpel blade:  11   Wound management:  Probed and deloculated   Drainage:  Purulent   Drainage amount:  Moderate   Wound treatment:  Wound left open   Packing materials:  None Post-procedure details:    Patient tolerance of procedure:  Tolerated well, no immediate complications   Medications Ordered in ED Medications  doxycycline (VIBRA-TABS) tablet 100 mg (not administered)  lidocaine (XYLOCAINE) 2 % (with pres) injection 200 mg (200 mg Infiltration Given by Other 04/18/17 0001)      Initial Impression / Assessment and Plan / ED Course  I have reviewed the triage vital signs and the nursing notes.  Pertinent lab results that were available during my care of the patient were reviewed by me and considered in my medical decision making (see chart for details).  Perineal abscess.  Patient is nontoxic in appearance, no concerns for Fournier's gangrene.  However, he does not see a primary care physician, so we will check CBG.  Abscesses treated with incision and drainage. Discharged with prescription for doxycycline.  Recommended that if he has a recurrence, consider excision of underlying cyst rather than simple incision and drainage.  Final Clinical Impressions(s) / ED Diagnoses   Final diagnoses:  Abscess of perineum    ED Discharge Orders        Ordered    doxycycline (VIBRAMYCIN) 100 MG capsule  2 times daily     04/18/17 0036       Dione BoozeGlick, Amish Mintzer, MD 04/18/17 0041

## 2017-04-17 NOTE — ED Triage Notes (Signed)
C/o rectal abscess x 2-3 days-NAD-slow gait

## 2017-04-18 LAB — CBG MONITORING, ED: GLUCOSE-CAPILLARY: 100 mg/dL — AB (ref 65–99)

## 2017-04-18 MED ORDER — DOXYCYCLINE HYCLATE 100 MG PO CAPS: 100.0000 mg | ORAL_CAPSULE | Freq: Two times a day (BID) | ORAL | 0 refills | Status: AC

## 2017-04-18 MED ORDER — DOXYCYCLINE HYCLATE 100 MG PO TABS
100.0000 mg | ORAL_TABLET | Freq: Once | ORAL | Status: AC
  Administered 2017-04-18: 100 mg via ORAL
  Filled 2017-04-18: qty 1

## 2017-04-18 NOTE — ED Notes (Signed)
Pt had difficulty sitting d/t pain to his perineal area, s/p I & D.

## 2017-06-08 ENCOUNTER — Encounter (HOSPITAL_BASED_OUTPATIENT_CLINIC_OR_DEPARTMENT_OTHER): Payer: Self-pay

## 2017-06-08 ENCOUNTER — Other Ambulatory Visit: Payer: Self-pay

## 2017-06-08 DIAGNOSIS — R1110 Vomiting, unspecified: Secondary | ICD-10-CM | POA: Insufficient documentation

## 2017-06-08 DIAGNOSIS — R197 Diarrhea, unspecified: Secondary | ICD-10-CM | POA: Diagnosis present

## 2017-06-08 NOTE — ED Triage Notes (Signed)
C/o n/v/d-NAD-steady gait

## 2017-06-09 ENCOUNTER — Emergency Department (HOSPITAL_BASED_OUTPATIENT_CLINIC_OR_DEPARTMENT_OTHER)
Admission: EM | Admit: 2017-06-09 | Discharge: 2017-06-09 | Disposition: A | Discharge status: Home / Self Care | Payer: Commercial Managed Care - PPO | Attending: Emergency Medicine | Admitting: Emergency Medicine

## 2017-06-09 DIAGNOSIS — R197 Diarrhea, unspecified: Secondary | ICD-10-CM

## 2017-06-09 DIAGNOSIS — R1110 Vomiting, unspecified: Secondary | ICD-10-CM

## 2017-06-09 NOTE — ED Notes (Signed)
ED Provider at bedside. 

## 2017-06-09 NOTE — ED Provider Notes (Signed)
[facility] EMERGENCY DEPARTMENT Provider Note   CSN: [id number] Arrival date & time: 06/08/17  2140     History   Chief Complaint Chief Complaint  Patient presents with  . Diarrhea    HPI Henry Andrews is a 37 y.o. male.  The history is provided by the patient.  Diarrhea   This is a new problem. The current episode started yesterday. The problem has been gradually worsening. The stool consistency is described as watery. There has been no fever. Associated symptoms include vomiting. Pertinent negatives include no abdominal pain. He has tried nothing for the symptoms.   Patient reports onset of nausea/vomiting/diarrhea about 24 hours ago.  He had 2 episodes of vomiting, but has had multiple episodes of diarrhea.  The vomiting/diarrhea both nonbloody.  No fever.  No abdominal pain.  No recent antibiotics.  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  No travel.  He does have sick contacts. Past Medical History:  Diagnosis Date  . Vertigo     There are no active problems to display for this patient.   History reviewed. No pertinent surgical history.      Home Medications    Prior to Admission medications   Medication Sig Start Date End Date Taking? Authorizing Provider  doxycycline (VIBRAMYCIN) 100 MG capsule Take 1 capsule (100 mg total) by mouth 2 (two) times daily. 04/18/17   Dione Booze, MD    Family History No family history on file.  Social History Social History   Tobacco Use  . Smoking status: Current Every Day Smoker    Packs/day: 2.00    Types: Cigarettes  . Smokeless tobacco: Never Used  Substance Use Topics  . Alcohol use: Yes    Comment: occ  . Drug use: No     Allergies   Patient has no known allergies.   Review of Systems Review of Systems  Constitutional: Negative for fever.  Gastrointestinal: Positive for diarrhea and vomiting. Negative for abdominal pain.  All other systems reviewed and are negative.    Physical  Exam Updated Vital Signs BP 135/85   Pulse 68   Temp 98.3 F (36.8 C)   Resp 18   Ht 1.753 m ( )   Wt 120.2 kg (264 lb 14.4 oz)   SpO2 100%   BMI 39.12 kg/m   Physical Exam CONSTITUTIONAL: Well developed/well nourished HEAD: Normocephalic/atraumatic EYES: EOMI/PERRL no icterus ENMT: Mucous membranes moist NECK: supple no meningeal signs SPINE/BACK:entire spine nontender CV: S1/S2 noted, no murmurs/rubs/gallops noted LUNGS: Lungs are clear to auscultation bilaterally, no apparent distress ABDOMEN: soft, nontender, no rebound or guarding, bowel sounds noted throughout abdomen NEURO: Pt is awake/alert/appropriate, moves all extremitiesx4.  No facial droop.   EXTREMITIES: pulses normal/equal, full ROM SKIN: warm, color normal PSYCH: no abnormalities of mood noted, alert and oriented to situation   ED Treatments / Results  Labs (all labs ordered are listed, but only abnormal results are displayed) Labs Reviewed - No data to display  EKG None  Radiology No results found.  Procedures Procedures (including critical care time)  Medications Ordered in ED Medications - No data to display   Initial Impression / Assessment and Plan / ED Course  I have reviewed the triage vital signs and the nursing notes.   Patient well-appearing.  He was sleeping on my arrival to room.  He has no new complaints.  He feels he is now improving.  He declines any further testing or meds.  He is appropriate for  discharge  Final Clinical Impressions(s) / ED Diagnoses   Final diagnoses:  Vomiting and diarrhea    ED Discharge Orders    None       Zadie Rhine, MD 06/09/17 [phone]

## 2018-03-14 IMAGING — DX DG CHEST 2V
2 series · 2 of 2 positions shown · non-contrast
Comparison: March 14, 2017

CLINICAL DATA: Chest pain

EXAM:
CHEST  2 VIEW

[chest pa]
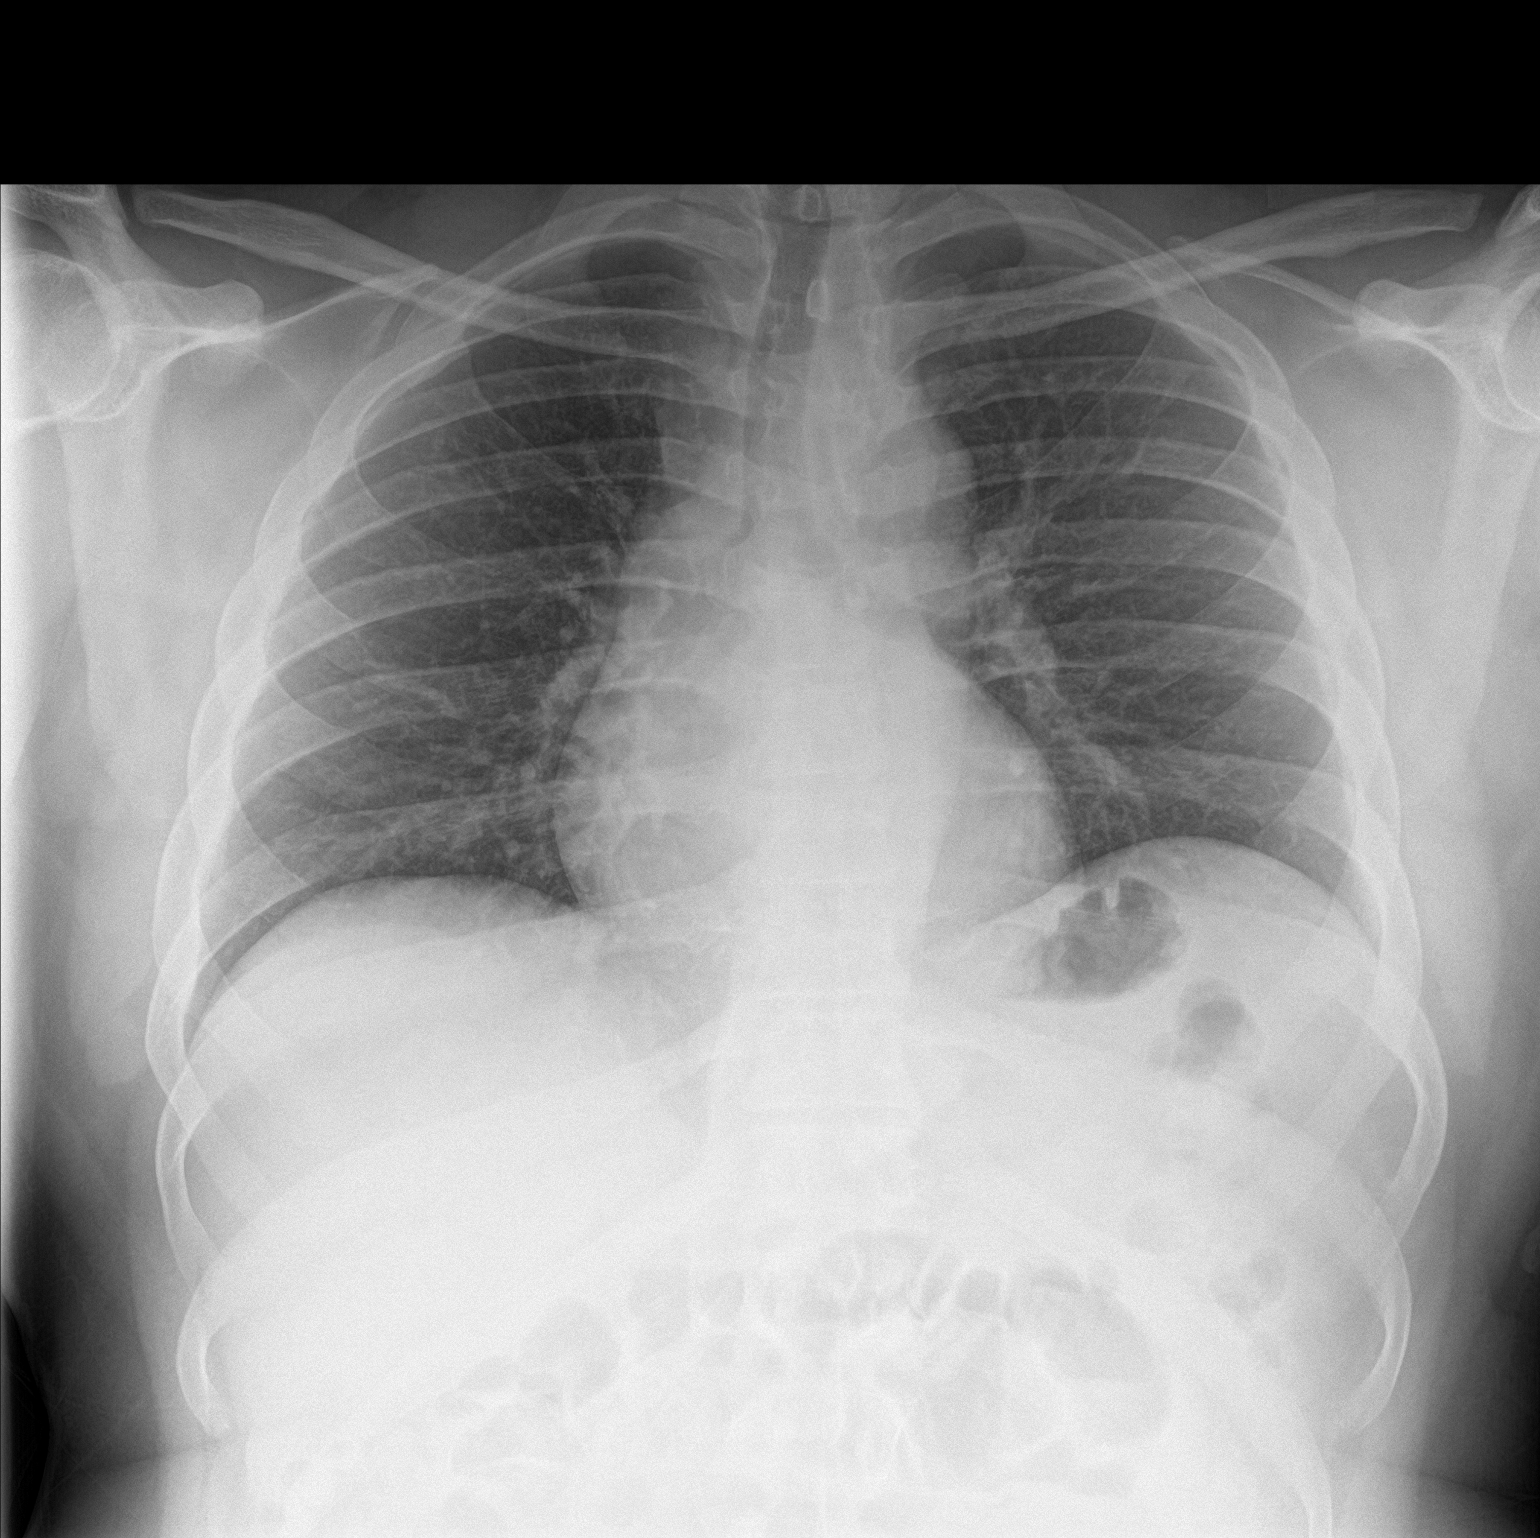

[chest lat]
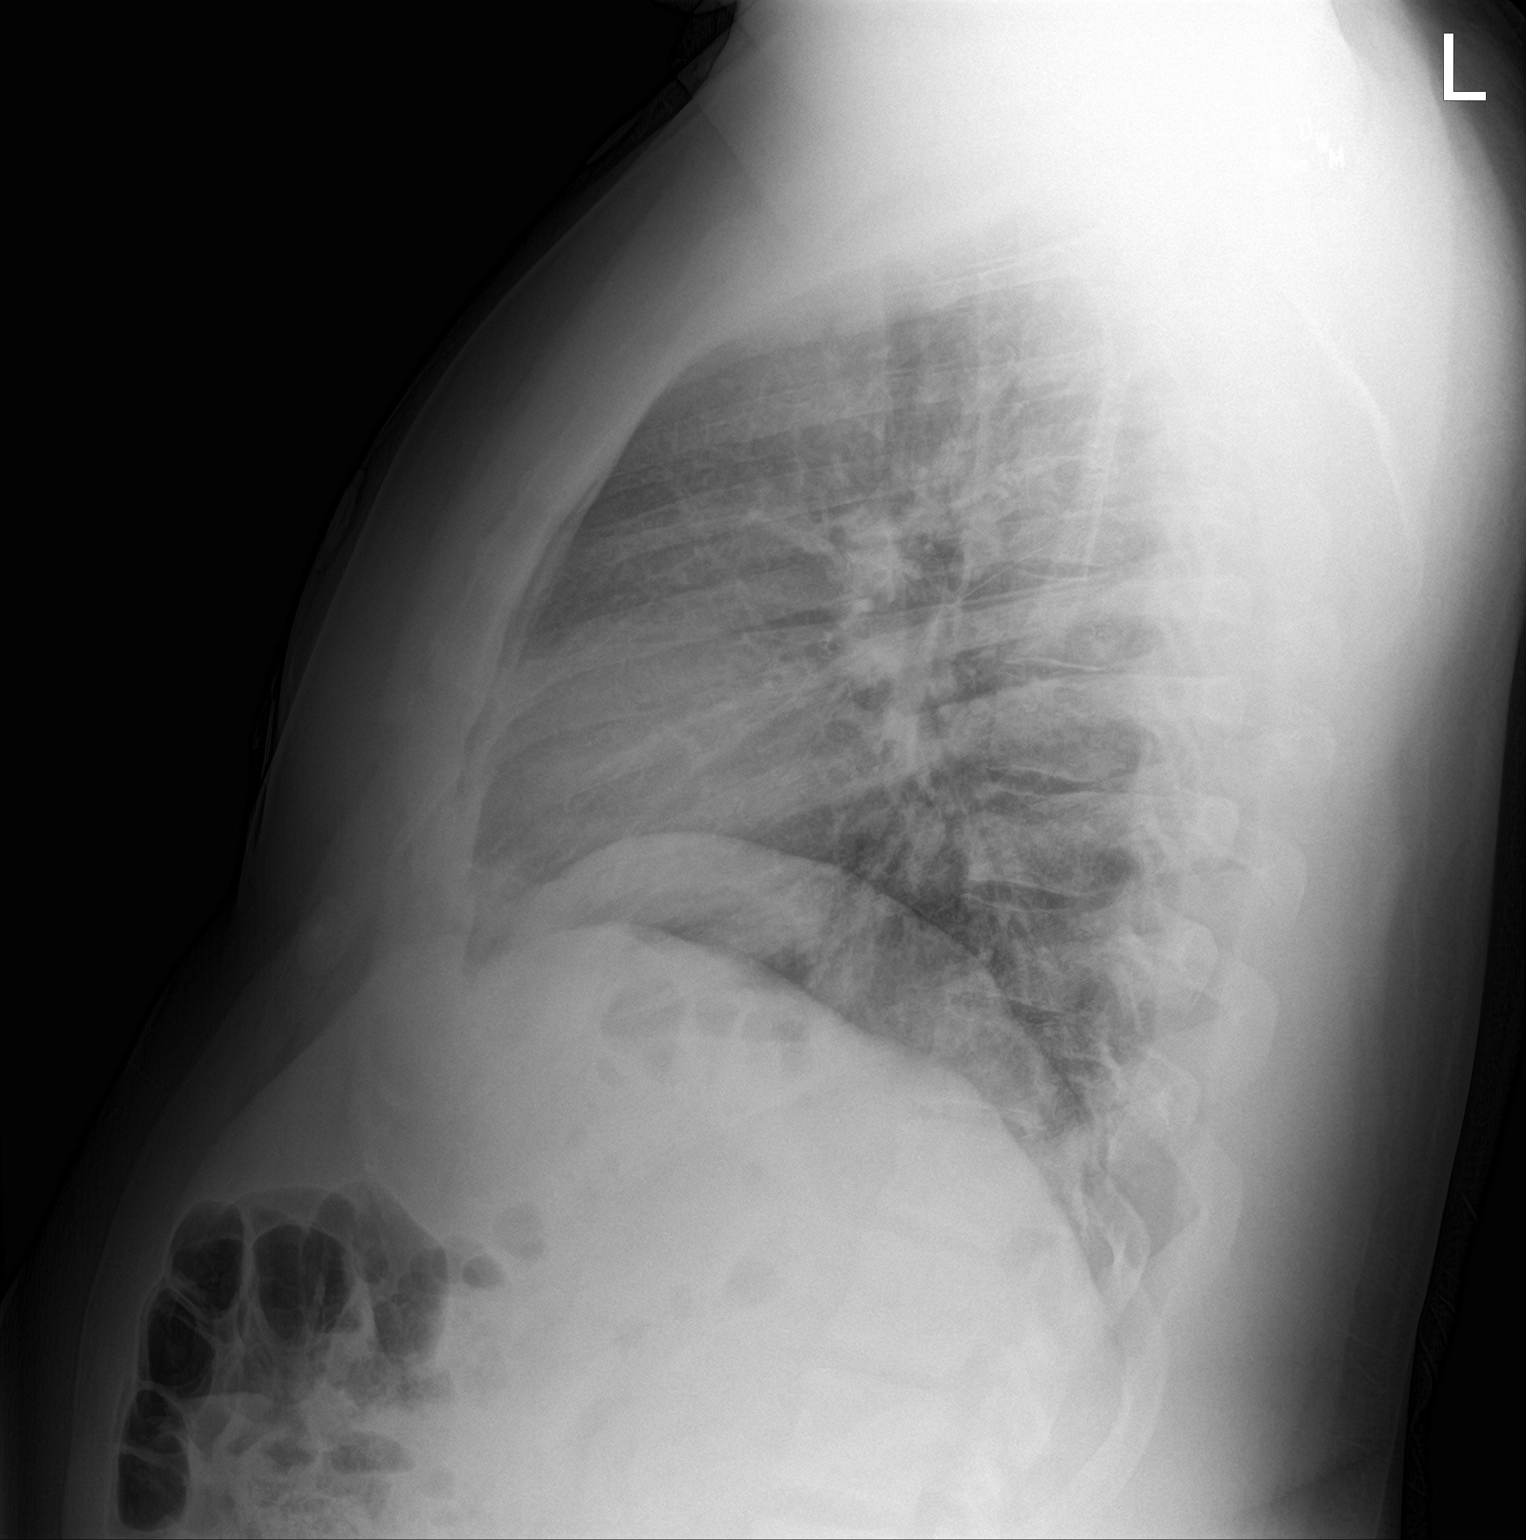

[2 of 2 positions shown; findings below may reference images not displayed]

FINDINGS: Lungs are clear. The heart size and pulmonary vascularity are
normal. No adenopathy. No pneumothorax. No bone lesions.
IMPRESSION: No edema or consolidation.
